# Patient Record
Sex: Female | Born: 1995
Health system: Southern US, Community
[De-identification: ages and names within clinical notes are randomized; demographics above are authoritative.]

## PROBLEM LIST (undated history)

## (undated) DIAGNOSIS — Q2112 Patent foramen ovale: Secondary | ICD-10-CM

## (undated) DIAGNOSIS — B2790 Infectious mononucleosis, unspecified without complication: Secondary | ICD-10-CM

## (undated) DIAGNOSIS — Q211 Atrial septal defect: Secondary | ICD-10-CM

## (undated) HISTORY — DX: Infectious mononucleosis, unspecified without complication: B27.90

## (undated) HISTORY — DX: Patent foramen ovale: Q21.12

## (undated) HISTORY — PX: OTHER SURGICAL HISTORY: SHX169

## (undated) HISTORY — DX: Atrial septal defect: Q21.1

---
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
## 2017-02-13 ENCOUNTER — Ambulatory Visit (INDEPENDENT_AMBULATORY_CARE_PROVIDER_SITE_OTHER): Payer: BLUE CROSS/BLUE SHIELD | Admitting: Family Medicine

## 2017-02-13 ENCOUNTER — Encounter: Payer: Self-pay | Admitting: Family Medicine

## 2017-02-13 VITALS — BP 96/56 | HR 86 | Temp 98.9°F | Ht 72.05 in | Wt 147.6 lb

## 2017-02-13 DIAGNOSIS — Z0000 Encounter for general adult medical examination without abnormal findings: Secondary | ICD-10-CM | POA: Diagnosis not present

## 2017-02-13 DIAGNOSIS — Z3009 Encounter for other general counseling and advice on contraception: Secondary | ICD-10-CM

## 2017-02-13 DIAGNOSIS — Z13220 Encounter for screening for lipoid disorders: Secondary | ICD-10-CM | POA: Diagnosis not present

## 2017-02-13 DIAGNOSIS — D649 Anemia, unspecified: Secondary | ICD-10-CM | POA: Insufficient documentation

## 2017-02-13 DIAGNOSIS — D500 Iron deficiency anemia secondary to blood loss (chronic): Secondary | ICD-10-CM | POA: Diagnosis not present

## 2017-02-13 LAB — BASIC METABOLIC PANEL
BUN: 11 mg/dL (ref 6–23)
CO2: 26 mEq/L (ref 19–32)
Calcium: 9.2 mg/dL (ref 8.4–10.5)
Chloride: 104 mEq/L (ref 96–112)
Creatinine, Ser: 0.74 mg/dL (ref 0.40–1.50)
GFR: 173.65 mL/min (ref >60.00)
Glucose, Bld: 83 mg/dL (ref 70–99)
Potassium: 3.7 mEq/L (ref 3.5–5.1)
Sodium: 137 mEq/L (ref 135–145)

## 2017-02-13 LAB — LIPID PANEL
Cholesterol: 187 mg/dL (ref 0–200)
HDL: 50.9 mg/dL (ref >39.00)
LDL Cholesterol: 112 mg/dL — ABNORMAL HIGH (ref 0–99)
NonHDL: 136.57
Total CHOL/HDL Ratio: 4
Triglycerides: 121 mg/dL (ref 0.0–149.0)
VLDL: 24.2 mg/dL (ref 0.0–40.0)

## 2017-02-13 LAB — CBC
HCT: 32.1 % — ABNORMAL LOW (ref 36.0–49.0)
Hemoglobin: 10.6 g/dL — ABNORMAL LOW (ref 12.0–16.0)
MCHC: 33.2 g/dL (ref 31.0–37.0)
MCV: 88.4 fl (ref 78.0–98.0)
Platelets: 286 10*3/uL (ref 150.0–575.0)
RBC: 3.63 Mil/uL — ABNORMAL LOW (ref 3.80–5.70)
RDW: 15.2 % (ref 11.4–15.5)
WBC: 6.8 10*3/uL (ref 4.5–13.5)

## 2017-02-13 LAB — FERRITIN: Ferritin: 6.2 ng/mL — ABNORMAL LOW (ref 22.0–322.0)

## 2017-02-13 NOTE — Patient Instructions (Addendum)
Sign release of information at the check out desk for gynecology records for any labs in the last 3 years  Records show only 1/3 gardasil shots in state record. Is it possible you had the rest somewhere else? Can we sign release of information for [facility] [place] for immunizations only. This is to reduce risk of cervical cancer.   Happy to see you yearly for physical and can also see GYN yearly for physical. Some younger patients like yourself will come in every 2-2.5 years as well which is fine

## 2017-02-13 NOTE — Progress Notes (Signed)
Phone: [phone]  Subjective:  Patient presents today to establish care.  Prior patient - last saw pediatrician- [facility]. Chief complaint-noted.   See problem oriented charting  The following were reviewed and entered/updated in epic: Past Medical History:  Diagnosis Date  . Mononucleosis    spring semester 2018  . PFO (patent foramen ovale)    closed without intervention   Patient Active Problem List   Diagnosis Date Noted  . Anemia 02/13/2017   Past Surgical History:  Procedure Laterality Date  . none      Family History  Problem Relation Age of Onset  . Anemia Mother   . Healthy Father   . Healthy Sister   . Healthy Brother   . Breast cancer Paternal Grandmother        died in 38s from this  . Breast cancer Paternal Aunt        died late 2s. not getting regular mammograms    Medications- reviewed and updated Current Outpatient Prescriptions  Medication Sig Dispense Refill  . etonogestrel-ethinyl estradiol (NUVARING) 0.12-0.015 MG/24HR vaginal ring Place 1 each vaginally every 28 (twenty-eight) days. Insert vaginally and leave in place for 3 consecutive weeks, then remove for 1 week.     No current facility-administered medications for this visit.     Allergies-reviewed and updated No Known Allergies  Social History   Social History  . Marital status: Single    Spouse name: N/A  . Number of children: N/A  . Years of education: N/A   Social History Main Topics  . Smoking status: Never Smoker  . Smokeless tobacco: Never Used  . Alcohol use No  . Drug use: No  . Sexual activity: Yes    Birth control/ protection: Other-see comments     Comment: Nuvaring   Other Topics Concern  . None   Social History Narrative   Single. Dating.       School at [employer]. 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       Hobbies: time with friends, movies    ROS--Full ROS was completed Review of Systems  Constitutional: Negative for chills and fever.  HENT: Negative for hearing loss and tinnitus.   Eyes: Positive for blurred vision (wears readers). Negative for double vision.  Respiratory: Negative for cough and hemoptysis.   Cardiovascular: Negative for chest pain and palpitations.  Gastrointestinal: Negative for heartburn and nausea.  Genitourinary: Negative for dysuria and urgency.  Musculoskeletal: Negative for myalgias and neck pain.  Skin: Negative for itching and rash.  Neurological: Negative for dizziness and headaches.  Endo/Heme/Allergies: Negative for polydipsia. Does not bruise/bleed easily.  Psychiatric/Behavioral: Negative for hallucinations and substance abuse.   Objective: BP (!) 96/56 (BP Location: Left Arm, Patient Position: Sitting, Cuff Size: Large)   Pulse 86   Temp 98.9 F (37.2 C) (Oral)   Ht 6' 0.05" (1.83 m)   Wt 147 lb 9.6 oz (67 kg)   SpO2 97%   BMI 19.99 kg/m  Gen: NAD, resting comfortably HEENT: Mucous membranes are moist. Oropharynx normal. TM normal. Eyes: sclera and lids normal, PERRLA Neck: no thyromegaly, no cervical lymphadenopathy CV: RRR no murmurs rubs or gallops Lungs: CTAB no crackles, wheeze, rhonchi Abdomen: soft/nontender/nondistended/normal bowel sounds. No rebound or guarding.  Ext:  no edema Skin: warm, dry Neuro: 5/5 strength in upper and lower extremities, normal gait, normal reflexes  Assessment/Plan:  21 y.o. female presenting for annual physical.  Health Maintenance counseling: 1. Anticipatory guidance: Patient counseled regarding regular dental exams q6 months, eye exams - sees every few years- far sighted, wearing seatbelts.  2. Risk factor reduction:  Advised patient of need for regular exercise and diet rich and fruits and vegetables to reduce risk of heart attack and stroke.  Exercise- cardio and lifting, abs. Diet- balanced diet.  Wt Readings from Last 3 Encounters:  02/13/17 147 lb 9.6 oz (67 kg) (37 %, Z= -0.32)*  3. Immunizations/screenings/ancillary studies- declines meningitis B as does not live in dorms. Unclear if had all gardasil- records show 1/3 so will get records Immunization History  Administered Date(s) Administered  . Tdap 01/23/2008  4. Cervical cancer screening- pap smear at age 21 needed 5. Breast cancer screening-  breast exam with GYN and mammogram - start at 35 screening- then yearly at 4040 (family history on dad's side though likely doesn't raise risk as much as if on moms side) 6. Colon cancer screening - no family history, start at age 21 7. STD screening- with GYN- negative within a year and no new partners since that time. Advised always using protection despite nuvaring- she is doing that.   Status of chronic or acute concerns   BP tends to run low  Anemia from heavy periods- get established with local GYN per her preference not to drive to [place]. Would prefer to see GYN for prescribing birth control as well. Update cbc and ferritin. She takes iron with periods only and discussed could take more regularly  1 year CPE  Orders Placed This Encounter  Procedures  . Basic metabolic panel    [place]    Order Specific Question:   Has the patient fasted?    Answer:   No  . CBC    [place]  . Lipid panel    [place]    Order Specific Question:   Has the patient fasted?    Answer:   No  . Ferritin  . Ambulatory referral to Gynecology    Referral Priority:   Routine    Referral Type:   Consultation    Referral Reason:   Specialty Services Required    Requested Specialty:   Gynecology    Number of Visits Requested:   1    Meds ordered this encounter  Medications  . etonogestrel-ethinyl estradiol (NUVARING) 0.12-0.015 MG/24HR vaginal ring    Sig: Place 1 each vaginally every 28 (twenty-eight) days. Insert vaginally and leave  in place for 3 consecutive weeks, then remove for 1 week.    Return precautions advised.  Tana ConchStephen Anwita Mencer, MD

## 2017-02-15 ENCOUNTER — Telehealth: Payer: Self-pay | Admitting: Family Medicine

## 2017-02-15 NOTE — Telephone Encounter (Signed)
Wanda IvoryGabrielle only received 1/3 of her gardasil vaccinations per records. I would recommend that she get 2 more injections 4 months apart. Please call her to suggest our recommendations  This is likely not quite as effective if she got all 3 within 6 months as suggested but I think it is reasonable and wise to do to reduce risks of cervical cancer.

## 2017-03-01 NOTE — Telephone Encounter (Signed)
Called and left a voicemail message asking for a return phone call 

## 2017-03-01 NOTE — Telephone Encounter (Signed)
Spoke with pt and advised. She is away at school and will call back to make an appt. Nothing further needed at this time.

## 2017-06-19 ENCOUNTER — Telehealth: Payer: Self-pay | Admitting: Family Medicine

## 2017-06-19 NOTE — Telephone Encounter (Signed)
Error. Please see note below ° °

## 2017-06-19 NOTE — Telephone Encounter (Signed)
Please advise regarding referral request.   Copied from CRM [phone]. Topic: Referral - Request >> Jun 19, 2017 12:34 PM Terisa Starraylor, Brittany L wrote: Reason for CRM:  Pt would like Dr Durene CalHunter to refer her to a OBGYN. Call back is [phone].

## 2017-06-20 ENCOUNTER — Other Ambulatory Visit: Payer: Self-pay

## 2017-06-20 DIAGNOSIS — Z124 Encounter for screening for malignant neoplasm of cervix: Secondary | ICD-10-CM

## 2017-06-20 NOTE — Telephone Encounter (Signed)
Referral placed for OBGYN

## 2020-03-12 ENCOUNTER — Ambulatory Visit (INDEPENDENT_AMBULATORY_CARE_PROVIDER_SITE_OTHER): Payer: BC Managed Care – PPO | Admitting: Obstetrics and Gynecology

## 2020-03-12 ENCOUNTER — Encounter: Payer: Self-pay | Admitting: Obstetrics and Gynecology

## 2020-03-12 ENCOUNTER — Other Ambulatory Visit: Payer: Self-pay

## 2020-03-12 VITALS — BP 126/80 | Ht 75.0 in | Wt 135.0 lb

## 2020-03-12 DIAGNOSIS — N644 Mastodynia: Secondary | ICD-10-CM

## 2020-03-12 DIAGNOSIS — N6001 Solitary cyst of right breast: Secondary | ICD-10-CM

## 2020-03-12 DIAGNOSIS — Z3009 Encounter for other general counseling and advice on contraception: Secondary | ICD-10-CM

## 2020-03-12 DIAGNOSIS — N946 Dysmenorrhea, unspecified: Secondary | ICD-10-CM

## 2020-03-12 DIAGNOSIS — N920 Excessive and frequent menstruation with regular cycle: Secondary | ICD-10-CM

## 2020-03-12 MED ORDER — TRANEXAMIC ACID 650 MG PO TABS: 1300.0000 mg | ORAL_TABLET | Freq: Three times a day (TID) | ORAL | 3 refills | Status: AC

## 2020-03-12 MED ORDER — NORETHINDRONE 0.35 MG PO TABS: 1.0000 | ORAL_TABLET | Freq: Every day | ORAL | 4 refills | Status: DC

## 2020-03-12 NOTE — Progress Notes (Signed)
Wanda Owens 1995/12/31 [id number]  SUBJECTIVE:  24 y.o. G0P0000 female new patient presents for evaluation of bilateral breast pain and also chronically heavy periods.  Periods are heavy with bad cramps for the first few days of period.  Periods last 5 days or less.  Her mother is an ER physician.  She has tried birth control pills with several different types and formulations, also tried NuvaRing.  She feels her periods have continued to be heavy with bad cramps.  She does take Midol and ibuprofen for strong cramp relief.  She denies any history of previous DVT/blood clots.  Hesitant to try Depo-Provera due to concern of possible weight gain. She also notes that her breasts get tender periodically throughout the month she thinks it might be worse when she is on her period.  She feels that the breast is swollen where the tenderness is when it is occurring.  She does not specifically feel any new breast lumps.  The discomfort occurs in both breasts.  Paternal side family history of breast cancer noted. 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  Her boyfriend is also Careers information officer.   Current Outpatient Medications  Medication Sig Dispense Refill   Birth control pill uncertain of which one    . Ferrous Sulfate (IRON PO) Take 1 tablet by mouth daily.     No current facility-administered medications for this visit.   Allergies: Patient has no known allergies.  Patient's last menstrual period was 02/28/2020.  Past medical history,surgical history, problem list, medications, allergies, family history and social history were all reviewed and documented as reviewed in the EPIC chart.  ROS: Pertinent positives and negatives as reviewed in HPI.   OBJECTIVE:  BP 126/80 (BP Location: Right Arm, Patient Position: Sitting, Cuff Size: Normal)   Ht 6' 3" (1.905 m)   Wt 135 lb (61.2 kg)   LMP 02/28/2020   BMI 16.87 kg/m  The patient appears well, alert, oriented x 3, in no  distress.  BREAST EXAM: breasts appear normal, diffusely nodular breasts, the right lower quadrant at the lateral aspect of the breast 4 cm off of the areola there is a soft mobile cystic mass about 2 to 3 cm in diameter, otherwise no suspicious masses, no skin or nipple changes or axillary nodes, no breast tenderness  Pelvic exam deferred by patient, she prefers to have a Pap smear at a future visit  Chaperone: Wandra Scot Bonham present during the examination   ASSESSMENT:  24 y.o. G0P0000 here for counseling regarding management of heavy menstrual periods with dysmenorrhea and intermittent bilateral breast pain  PLAN:  1.  Bilateral breast pain.  The patient indicates she typically does not wear bras due to difficulty in finding an appropriate size and it frequently causes her discomfort with wearing.  I recommended that she consider wearing a suitable bra that is comfortable for her as the lack of support chronically may cause musculoskeletal pain.  On the examination today, she does have diffusely cystic/nodular breasts, and there is a prominent cystic area on the right breast as noted above.  I will have staff coordinate a breast imaging-ultrasound for the patient to evaluate that right breast cyst.  I also encouraged the patient to keep track of her symptoms and the menstrual diary because it sounds like there also might be some hormonal component to it, she also has been trying different birth control pills and possibly this might be causing her to have also have  some increased breast symptoms.  Paternal family history of breast cancer as noted, uncertain if any BRCA testing was done.  I recommended to the patient that she check into that.  No maternal side family history of breast cancer.  The patient denies any known family history of uterine/endometrial, ovarian, stomach, colon cancers.  2.  Menorrhagia and dysmenorrhea.  I am not sure of which birth controls the patient has tried.  She is  hesitant to try injectables and implantable's such as the Nexplanon and IUD.  Alternative option I presented to her would be to try tranexamic acid/Lysteda 1300 mg 3 times daily x5 days, starting with onset of period.  She wanted to do that so I have sent a prescription to her pharmacy.  We discussed that due to changes in blood coagulation profile and behavior that I would recommend against simultaneously also using a typical combined hormonal contraceptive pill such as the one she is probably taking now.  I switched her over to the Ortho Micronor progestin only pill and gave her a prescription for that.  She will finish out her current birth control pill pack and then when she gets to her placebo week and has her period she will start the TXA at that point and then after her period is done she will start on the progestin only pill.  We reviewed that the progestin only pill is extremely sensitive to missed doses and missed timing of doses in order for it to provide contraceptive effect and failure is fairly common.  I highly suggested that she consider one of the progestin only LARC options and I will leave some information in her my chart regarding those options.  We also covered timed ibuprofen administration, every 6 hours take 400 to 600 mg with food for up to 5 days starting the day before she expects her period to start.  If that does not work for the dysmenorrhea then we could write a prescription for diclofenac or mefenamic acid instead.  I did recommend that she follow-up for a Pap smear and pelvic exam in the near future and we will see how she is doing on this new regimen at that time.    Joseph Pierini MD 03/12/20

## 2020-03-12 NOTE — Patient Instructions (Signed)
Dysmenorrhea  Dysmenorrhea refers to cramps caused by the muscles of the uterus tightening (contracting) during a menstrual period. Dysmenorrhea may be mild, or it may be severe enough to interfere with everyday activities for a few days each month. Primary dysmenorrhea is menstrual cramps that last a couple of days when you start having menstrual periods or soon after. This often begins after a teenager starts having her period. As a woman gets older or has a baby, the cramps will usually lessen or disappear. Secondary dysmenorrhea begins later in life and is caused by a disorder in the reproductive system. It lasts longer, and it may cause more pain than primary dysmenorrhea. The pain may start before the period and last a few days after the period. What are the causes? Dysmenorrhea is usually caused by an underlying problem, such as:  The tissue that lines the uterus (endometrium) growing outside of the uterus in other areas of the body (endometriosis).  Endometrial tissue growing into the muscular walls of the uterus (adenomyosis).  Blood vessels in the pelvis becoming filled with blood just before the menstrual period (pelvic congestive syndrome).  Overgrowth of cells (polyps) in the endometrium or the lower part of the uterus (cervix).  The uterus dropping down into the vagina (prolapse) due to stretched or weak muscles.  Bladder problems, such as infection or inflammation.  Intestinal problems, such as a tumor or irritable bowel syndrome.  Cancer of the reproductive organs or bladder.  A severely tipped uterus.  A cervix that is closed or has a very small opening.  Noncancerous (benign) tumors of the uterus (fibroids).  Pelvic inflammatory disease (PID).  Pelvic scarring (adhesions) from a previous surgery.  An ovarian cyst.  An IUD (intrauterine device). What increases the risk? You are more likely to develop this condition if:  You are younger than age 23.  You  started puberty early.  You have irregular or heavy bleeding.  You have never given birth.  You have a family history of dysmenorrhea.  You smoke. What are the signs or symptoms? Symptoms of this condition include:  Cramping, throbbing pain, or a feeling of fullness in the lower abdomen.  Lower back pain.  Periods lasting for longer than 7 days.  Headaches.  Bloating.  Fatigue.  Nausea or vomiting.  Diarrhea.  Sweating or dizziness.  Loose stools. How is this diagnosed? This condition may be diagnosed based on:  Your symptoms.  Your medical history.  A physical exam.  Blood tests.  A Pap test. This is a test in which cells from the cervix are tested for signs of cancer or infection.  A pregnancy test.  Imaging tests, such as: ? Ultrasound. ? A procedure to remove and examine a sample of endometrial tissue (dilation and curettage, D&C). ? A procedure to visually examine the inside of:  The uterus (hysteroscopy).  The abdomen or pelvis (laparoscopy).  The bladder (cystoscopy).  The intestine (colonoscopy).  The stomach (gastroscopy). ? X-rays. ? CT scan. ? MRI. How is this treated? Treatment depends on the cause of the dysmenorrhea. Treatment may include:  Pain medicine prescribed by your health care provider.  Birth control pills that contain the hormone progesterone.  An IUD that contains the hormone progesterone.  Medicines to control bleeding.  Hormone replacement therapy.  NSAIDs. These may help to stop the production of hormones that cause cramps.  Antidepressant medicines.  Surgery to remove adhesions, endometriosis, ovarian cysts, fibroids, or the entire uterus (hysterectomy).  Injections of  progesterone to stop the menstrual period.  A procedure to destroy the endometrium (endometrial ablation).  A procedure to cut the nerves in the bottom of the spine (sacrum) that go to the reproductive organs (presacral neurectomy).  A  procedure to apply an electric current to nerves in the sacrum (sacral nerve stimulation).  Exercise and physical therapy.  Meditation and yoga therapy.  Acupuncture. Work with your health care provider to determine what treatment or combination of treatments is best for you. Follow these instructions at home: Relieving pain and cramping  Apply heat to your lower back or abdomen when you experience pain or cramps. Use the heat source that your health care provider recommends, such as a moist heat pack or a heating pad. ? Place a towel between your skin and the heat source. ? Leave the heat on for 20-30 minutes. ? Remove the heat if your skin turns bright red. This is especially important if you are unable to feel pain, heat, or cold. You may have a greater risk of getting burned. ? Do not sleep with a heating pad on.  Do aerobic exercises, such as walking, swimming, or biking. This can help to relieve cramps.  Massage your lower back or abdomen to help relieve pain. General instructions  Take over-the-counter and prescription medicines only as told by your health care provider.  Do not drive or use heavy machinery while taking prescription pain medicine.  Avoid alcohol and caffeine during and right before your menstrual period. These can make cramps worse.  Do not use any products that contain nicotine or tobacco, such as cigarettes and e-cigarettes. If you need help quitting, ask your health care provider.  Keep all follow-up visits as told by your health care provider. This is important. Contact a health care provider if:  You have pain that gets worse or does not get better with medicine.  You have pain with sex.  You develop nausea or vomiting with your period that is not controlled with medicine. Get help right away if:  You faint. Summary  Dysmenorrhea refers to cramps caused by the muscles of the uterus tightening (contracting) during a menstrual  period.  Dysmenorrhea may be mild, or it may be severe enough to interfere with everyday activities for a few days each month.  Treatment depends on the cause of the dysmenorrhea.  Work with your health care provider to determine what treatment or combination of treatments is best for you. This information is not intended to replace advice given to you by your health care provider. Make sure you discuss any questions you have with your health care provider. Document Revised: 06/01/2017 Document Reviewed: 07/22/2016 Elsevier Patient Education  2020 [employer]  Menorrhagia  Menorrhagia is a condition in which menstrual periods are heavy or last longer than normal. With menorrhagia, most periods a woman has may cause enough blood loss and cramping that she becomes unable to take part in her usual activities. What are the causes? Common causes of this condition include:  Noncancerous growths in the uterus (polyps or fibroids).  An imbalance of the estrogen and progesterone hormones.  One of the ovaries not releasing an egg during one or more months.  A problem with the thyroid gland (hypothyroid).  Side effects of having an intrauterine device (IUD).  Side effects of some medicines, such as anti-inflammatory medicines or blood thinners.  A bleeding disorder that stops the blood from clotting normally. In some cases, the cause of this condition is not  known. What are the signs or symptoms? Symptoms of this condition include:  Routinely having to change your pad or tampon every 1-2 hours because it is completely soaked.  Needing to use pads and tampons at the same time because of heavy bleeding.  Needing to wake up to change your pads or tampons during the night.  Passing blood clots larger than 1 inch (2.5 cm) in size.  Having bleeding that lasts for more than 7 days.  Having symptoms of low iron levels (anemia), such as tiredness, fatigue, or shortness of breath. How is  this diagnosed? This condition may be diagnosed based on:  A physical exam.  Your symptoms and menstrual history.  Tests, such as: ? Blood tests to check if you are pregnant or have hormonal changes, a bleeding or thyroid disorder, anemia, or other problems. ? Pap test to check for cancerous changes, infections, or inflammation. ? Endometrial biopsy. This test involves removing a tissue sample from the lining of the uterus (endometrium) to be examined under a microscope. ? Pelvic ultrasound. This test uses sound waves to create images of your uterus, ovaries, and vagina. The images can show if you have fibroids or other growths. ? Hysteroscopy. For this test, a small telescope is used to look inside your uterus. How is this treated? Treatment may not be needed for this condition. If it is needed, the best treatment for you will depend on:  Whether you need to prevent pregnancy.  Your desire to have children in the future.  The cause and severity of your bleeding.  Your personal preference. Medicines are the first step in treatment. You may be treated with:  Hormonal birth control methods. These treatments reduce bleeding during your menstrual period. They include: ? Birth control pills. ? Skin patch. ? Vaginal ring. ? Shots (injections) that you get every 3 months. ? Hormonal IUD (intrauterine device). ? Implants that go under the skin.  Medicines that thicken blood and slow bleeding.  Medicines that reduce swelling, such as ibuprofen.  Medicines that contain an artificial (synthetic) hormone called progestin.  Medicines that make the ovaries stop working for a short time.  Iron supplements to treat anemia. If medicines do not work, surgery may be done. Surgical options may include:  Dilation and curettage (D&C). In this procedure, your health care provider opens (dilates) your cervix and then scrapes or suctions tissue from the endometrium to reduce menstrual  bleeding.  Operative hysteroscopy. In this procedure, a small tube with a light on the end (hysteroscope) is used to view your uterus and help remove polyps that may be causing heavy periods.  Endometrial ablation. This is when various techniques are used to permanently destroy your entire endometrium. After endometrial ablation, most women have little or no menstrual flow. This procedure reduces your ability to become pregnant.  Endometrial resection. In this procedure, an electrosurgical wire loop is used to remove the endometrium. This procedure reduces your ability to become pregnant.  Hysterectomy. This is surgical removal of the uterus. This is a permanent procedure that stops menstrual periods. Pregnancy is not possible after a hysterectomy. Follow these instructions at home: Medicines  Take over-the-counter and prescription medicines exactly as told by your health care provider. This includes iron pills.  Do not change or switch medicines without asking your health care provider.  Do not take aspirin or medicines that contain aspirin 1 week before or during your menstrual period. Aspirin may make bleeding worse. General instructions  If you need  to change your sanitary pad or tampon more than once every 2 hours, limit your activity until the bleeding stops.  Iron pills can cause constipation. To prevent or treat constipation while you are taking prescription iron supplements, your health care provider may recommend that you: ? Drink enough fluid to keep your urine clear or pale yellow. ? Take over-the-counter or prescription medicines. ? Eat foods that are high in fiber, such as fresh fruits and vegetables, whole grains, and beans. ? Limit foods that are high in fat and processed sugars, such as fried and sweet foods.  Eat well-balanced meals, including foods that are high in iron. Foods that have a lot of iron include leafy green vegetables, meat, liver, eggs, and whole grain  breads and cereals.  Do not try to lose weight until the abnormal bleeding has stopped and your blood iron level is back to normal. If you need to lose weight, work with your health care provider to lose weight safely.  Keep all follow-up visits as told by your health care provider. This is important. Contact a health care provider if:  You soak through a pad or tampon every 1 or 2 hours, and this happens every time you have a period.  You need to use pads and tampons at the same time because you are bleeding so much.  You have nausea, vomiting, diarrhea, or other problems related to medicines you are taking. Get help right away if:  You soak through more than a pad or tampon in 1 hour.  You pass clots bigger than 1 inch (2.5 cm) wide.  You feel short of breath.  You feel like your heart is beating too fast.  You feel dizzy or faint.  You feel very weak or tired. Summary  Menorrhagia is a condition in which menstrual periods are heavy or last longer than normal.  Treatment will depend on the cause of the condition and may include medicines or procedures.  Take over-the-counter and prescription medicines exactly as told by your health care provider. This includes iron pills.  Get help right away if you have heavy bleeding that soaks through more than a pad or tampon in 1 hour, you are passing large clots, or you feel dizzy, faint or short of breath. This information is not intended to replace advice given to you by your health care provider. Make sure you discuss any questions you have with your health care provider. Document Revised: 09/26/2017 Document Reviewed: 06/12/2016 Elsevier Patient Education  2020 [employer]. Levonorgestrel intrauterine device (IUD) What is this medicine? LEVONORGESTREL IUD (LEE voe nor jes trel) is a contraceptive (birth control) device. The device is placed inside the uterus by a healthcare professional. It is used to prevent pregnancy. This  device can also be used to treat heavy bleeding that occurs during your period. This medicine may be used for other purposes; ask your health care provider or pharmacist if you have questions. COMMON BRAND NAME(S): Cameron Ali What should I tell my health care provider before I take this medicine? They need to know if you have any of these conditions:  abnormal Pap smear  cancer of the breast, uterus, or cervix  diabetes  endometritis  genital or pelvic infection now or in the past  have more than one sexual partner or your partner has more than one partner  heart disease  history of an ectopic or tubal pregnancy  immune system problems  IUD in place  liver disease or  tumor  problems with blood clots or take blood-thinners  seizures  use intravenous drugs  uterus of unusual shape  vaginal bleeding that has not been explained  an unusual or allergic reaction to levonorgestrel, other hormones, silicone, or polyethylene, medicines, foods, dyes, or preservatives  pregnant or trying to get pregnant  breast-feeding How should I use this medicine? This device is placed inside the uterus by a health care professional. Talk to your pediatrician regarding the use of this medicine in children. Special care may be needed. Overdosage: If you think you have taken too much of this medicine contact a poison control center or emergency room at once. NOTE: This medicine is only for you. Do not share this medicine with others. What if I miss a dose? This does not apply. Depending on the brand of device you have inserted, the device will need to be replaced every 3 to 6 years if you wish to continue using this type of birth control. What may interact with this medicine? Do not take this medicine with any of the following medications:  amprenavir  bosentan  fosamprenavir This medicine may also interact with the following  medications:  aprepitant  armodafinil  barbiturate medicines for inducing sleep or treating seizures  bexarotene  boceprevir  griseofulvin  medicines to treat seizures like carbamazepine, ethotoin, felbamate, oxcarbazepine, phenytoin, topiramate  modafinil  pioglitazone  rifabutin  rifampin  rifapentine  some medicines to treat HIV infection like atazanavir, efavirenz, indinavir, lopinavir, nelfinavir, tipranavir, ritonavir  St. John's wort  warfarin This list may not describe all possible interactions. Give your health care provider a list of all the medicines, herbs, non-prescription drugs, or dietary supplements you use. Also tell them if you smoke, drink alcohol, or use illegal drugs. Some items may interact with your medicine. What should I watch for while using this medicine? Visit your doctor or health care professional for regular check ups. See your doctor if you or your partner has sexual contact with others, becomes HIV positive, or gets a sexual transmitted disease. This product does not protect you against HIV infection (AIDS) or other sexually transmitted diseases. You can check the placement of the IUD yourself by reaching up to the top of your vagina with clean fingers to feel the threads. Do not pull on the threads. It is a good habit to check placement after each menstrual period. Call your doctor right away if you feel more of the IUD than just the threads or if you cannot feel the threads at all. The IUD may come out by itself. You may become pregnant if the device comes out. If you notice that the IUD has come out use a backup birth control method like condoms and call your health care provider. Using tampons will not change the position of the IUD and are okay to use during your period. This IUD can be safely scanned with magnetic resonance imaging (MRI) only under specific conditions. Before you have an MRI, tell your healthcare provider that you have an  IUD in place, and which type of IUD you have in place. What side effects may I notice from receiving this medicine? Side effects that you should report to your doctor or health care professional as soon as possible:  allergic reactions like skin rash, itching or hives, swelling of the face, lips, or tongue  fever, flu-like symptoms  genital sores  high blood pressure  no menstrual period for 6 weeks during use  pain, swelling, warmth in  the leg  pelvic pain or tenderness  severe or sudden headache  signs of pregnancy  stomach cramping  sudden shortness of breath  trouble with balance, talking, or walking  unusual vaginal bleeding, discharge  yellowing of the eyes or skin Side effects that usually do not require medical attention (report to your doctor or health care professional if they continue or are bothersome):  acne  breast pain  change in sex drive or performance  changes in weight  cramping, dizziness, or faintness while the device is being inserted  headache  irregular menstrual bleeding within first 3 to 6 months of use  nausea This list may not describe all possible side effects. Call your doctor for medical advice about side effects. You may report side effects to FDA at 1-800-FDA-1088. Where should I keep my medicine? This does not apply. NOTE: This sheet is a summary. It may not cover all possible information. If you have questions about this medicine, talk to your doctor, pharmacist, or health care provider.  2020 Elsevier/Gold Standard (2018-04-30 13:22:01) Etonogestrel implant What is this medicine? ETONOGESTREL (et oh noe JES trel) is a contraceptive (birth control) device. It is used to prevent pregnancy. It can be used for up to 3 years. This medicine may be used for other purposes; ask your health care provider or pharmacist if you have questions. COMMON BRAND NAME(S): Implanon, Nexplanon What should I tell my health care provider before  I take this medicine? They need to know if you have any of these conditions:  abnormal vaginal bleeding  blood vessel disease or blood clots  breast, cervical, endometrial, ovarian, liver, or uterine cancer  diabetes  gallbladder disease  heart disease or recent heart attack  high blood pressure  high cholesterol or triglycerides  kidney disease  liver disease  migraine headaches  seizures  stroke  tobacco smoker  an unusual or allergic reaction to etonogestrel, anesthetics or antiseptics, other medicines, foods, dyes, or preservatives  pregnant or trying to get pregnant  breast-feeding How should I use this medicine? This device is inserted just under the skin on the inner side of your upper arm by a health care professional. Talk to your pediatrician regarding the use of this medicine in children. Special care may be needed. Overdosage: If you think you have taken too much of this medicine contact a poison control center or emergency room at once. NOTE: This medicine is only for you. Do not share this medicine with others. What if I miss a dose? This does not apply. What may interact with this medicine? Do not take this medicine with any of the following medications:  amprenavir  fosamprenavir This medicine may also interact with the following medications:  acitretin  aprepitant  armodafinil  bexarotene  bosentan  carbamazepine  certain medicines for fungal infections like fluconazole, ketoconazole, itraconazole and voriconazole  certain medicines to treat hepatitis, HIV or AIDS  cyclosporine  felbamate  griseofulvin  lamotrigine  modafinil  oxcarbazepine  phenobarbital  phenytoin  primidone  rifabutin  rifampin  rifapentine  St. John's wort  topiramate This list may not describe all possible interactions. Give your health care provider a list of all the medicines, herbs, non-prescription drugs, or dietary supplements you  use. Also tell them if you smoke, drink alcohol, or use illegal drugs. Some items may interact with your medicine. What should I watch for while using this medicine? This product does not protect you against HIV infection (AIDS) or other sexually transmitted diseases.  You should be able to feel the implant by pressing your fingertips over the skin where it was inserted. Contact your doctor if you cannot feel the implant, and use a non-hormonal birth control method (such as condoms) until your doctor confirms that the implant is in place. Contact your doctor if you think that the implant may have broken or become bent while in your arm. You will receive a user card from your health care provider after the implant is inserted. The card is a record of the location of the implant in your upper arm and when it should be removed. Keep this card with your health records. What side effects may I notice from receiving this medicine? Side effects that you should report to your doctor or health care professional as soon as possible:  allergic reactions like skin rash, itching or hives, swelling of the face, lips, or tongue  breast lumps, breast tissue changes, or discharge  breathing problems  changes in emotions or moods  coughing up blood  if you feel that the implant may have broken or bent while in your arm  high blood pressure  pain, irritation, swelling, or bruising at the insertion site  scar at site of insertion  signs of infection at the insertion site such as fever, and skin redness, pain or discharge  signs and symptoms of a blood clot such as breathing problems; changes in vision; chest pain; severe, sudden headache; pain, swelling, warmth in the leg; trouble speaking; sudden numbness or weakness of the face, arm or leg  signs and symptoms of liver injury like dark yellow or brown urine; general ill feeling or flu-like symptoms; light-colored stools; loss of appetite; nausea; right  upper belly pain; unusually weak or tired; yellowing of the eyes or skin  unusual vaginal bleeding, discharge Side effects that usually do not require medical attention (report to your doctor or health care professional if they continue or are bothersome):  acne  breast pain or tenderness  headache  irregular menstrual bleeding  nausea This list may not describe all possible side effects. Call your doctor for medical advice about side effects. You may report side effects to FDA at 1-800-FDA-1088. Where should I keep my medicine? This drug is given in a hospital or clinic and will not be stored at home. NOTE: This sheet is a summary. It may not cover all possible information. If you have questions about this medicine, talk to your doctor, pharmacist, or health care provider.  2020 Elsevier/Gold Standard (2019-04-01 11:33:04)       Instructions for today  1.  Wanda Owens out your current birth control pill, when you start your placebo week/period, then start taking the tranexamic acid as prescribed 3 times daily for 5 days.  As you.  Is settling down I would recommend starting the new progestin only birth control pill at that point. **Do not take the old birth control pill (that has both estrogen and progesterone) and the TXA as this increases your DVT/internal vein blood clot risk which is dangerous**  2.  For heavy periods and cramping, it sometimes helps to start taking scheduled ibuprofen 400 to 600 mg every 6 hours with food starting the day before you expect your period to begin and continue taking it that way for 5 days  3.  Remember with taking the progesterone only pill you need to be very consistent about timing every day and not miss any doses or it might not provide for effective birth control  in any given cycle  4.  If you want to make an appointment for a pelvic exam and Pap smear please do so as soon as possible.  Also consider the Mirena/Skyla IUD or the Nexplanon for  other ways of providing effective birth control that are good at also helping with heavy periods and cramping  5.  Someone from our office will be contacting you about an imaging test to evaluate the breast cyst on the right side  6.  Consider keeping notes on your breast symptoms in relation to her menstrual cycle so we could review that at another time.  Consider trying to find a bra that is suitable and comfortable for more regular use as sometimes lack of breast support chronically can lead to more breast pain.

## 2020-03-15 ENCOUNTER — Telehealth: Payer: Self-pay | Admitting: *Deleted

## 2020-03-15 DIAGNOSIS — N6001 Solitary cyst of right breast: Secondary | ICD-10-CM

## 2020-03-15 NOTE — Telephone Encounter (Signed)
Patient scheduled on 03/18/20 @ 9:15am at the [facility] center of [facility]. Patient informed.

## 2020-03-15 NOTE — Telephone Encounter (Signed)
-----   Message from Theresia Majors, MD sent at 03/12/2020  5:15 PM EDT ----- Regarding: breast ultrasound Can we please help the patient schedule a breast ultrasound to evaluate a cystic lump in her right lateral lower quadrant of her right breast?  Thank you

## 2020-03-18 ENCOUNTER — Other Ambulatory Visit: Payer: Self-pay

## 2020-03-25 ENCOUNTER — Ambulatory Visit
Admission: RE | Admit: 2020-03-25 | Discharge: 2020-03-25 | Disposition: A | Discharge status: Home / Self Care | Payer: Self-pay | Source: Ambulatory Visit | Attending: Obstetrics and Gynecology | Admitting: Obstetrics and Gynecology

## 2020-03-25 ENCOUNTER — Other Ambulatory Visit: Payer: Self-pay

## 2020-03-25 DIAGNOSIS — N6001 Solitary cyst of right breast: Secondary | ICD-10-CM

## 2020-03-25 NOTE — Progress Notes (Signed)
Please tell patient that the right breast ultrasound looks okay but she should keep an eye on the lump that we detected on the exam let us know if there are any changes, pain, other concerning symptoms  Also I really want her to look into the genetic testing history that has been performed for her family in regards to breast cancer risk, and we should have her do genetic counseling on her own regarding her breast cancer risk due to her family history

## 2021-06-28 IMAGING — US US BREAST*R* LIMITED INC AXILLA
1 series · 12 of 12 positions shown · non-contrast
Comparison: None.

CLINICAL DATA: 23-year-old female with a palpable area of concern
in the retroareolar/lower outer quadrant of the right breast.

EXAM:
ULTRASOUND OF THE RIGHT BREAST

[Series 1: us breast*right* limited inc axilla · 0.06mm/px · 12 of 12 slices shown]
[im 1/12]
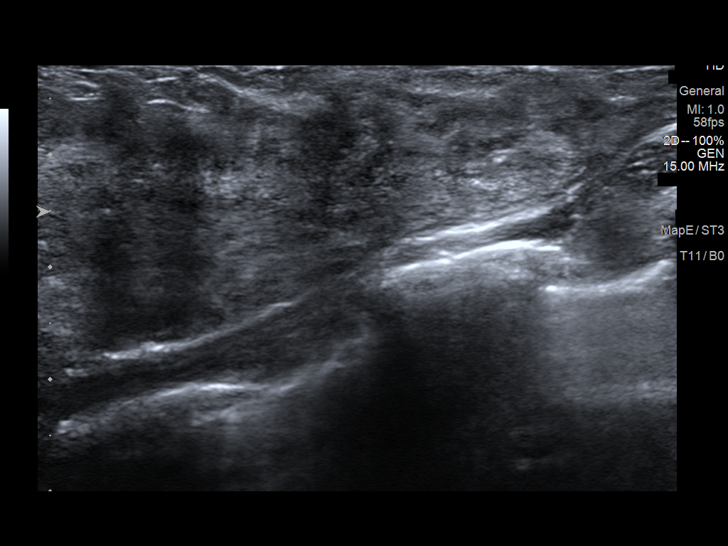
[im 2/12]
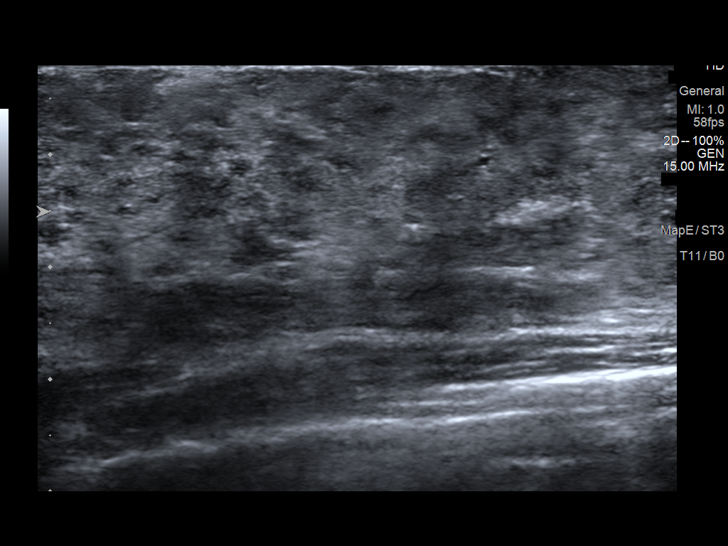
[im 3/12]
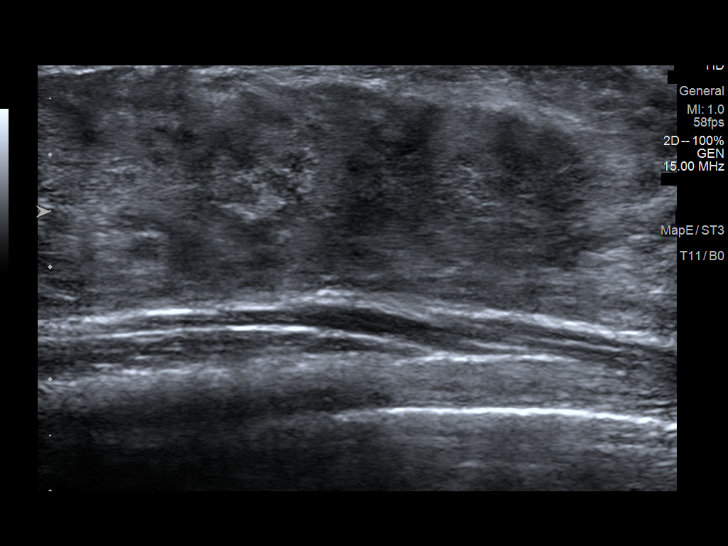
[im 4/12]
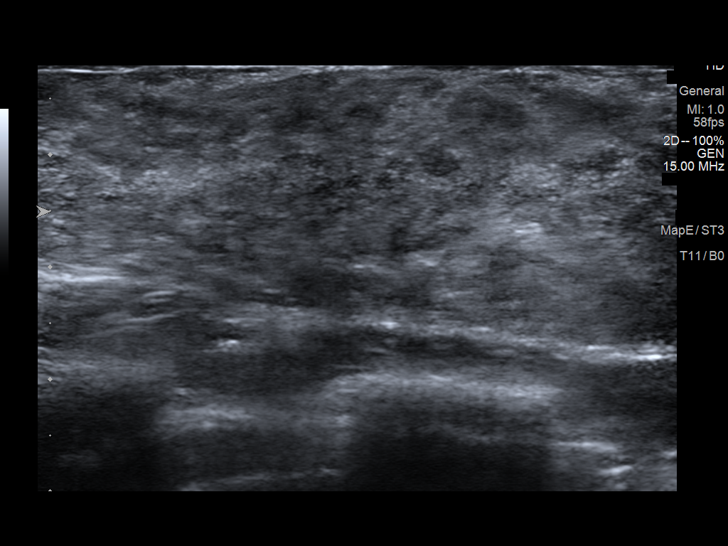
[im 5/12]
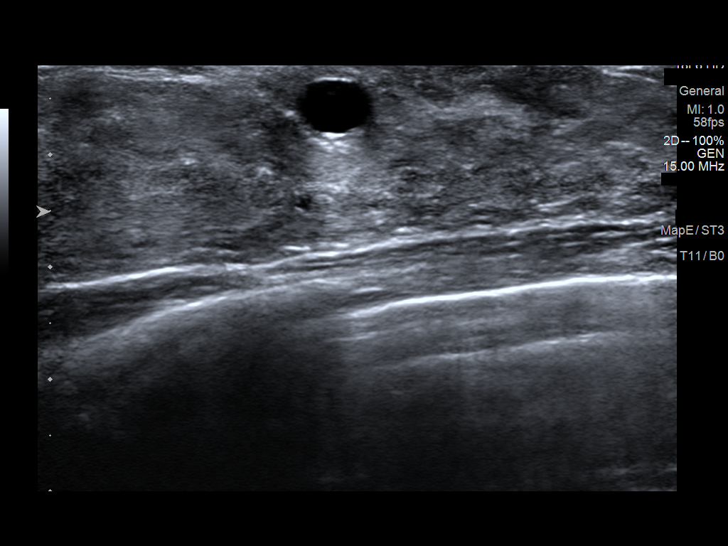
[im 6/12]
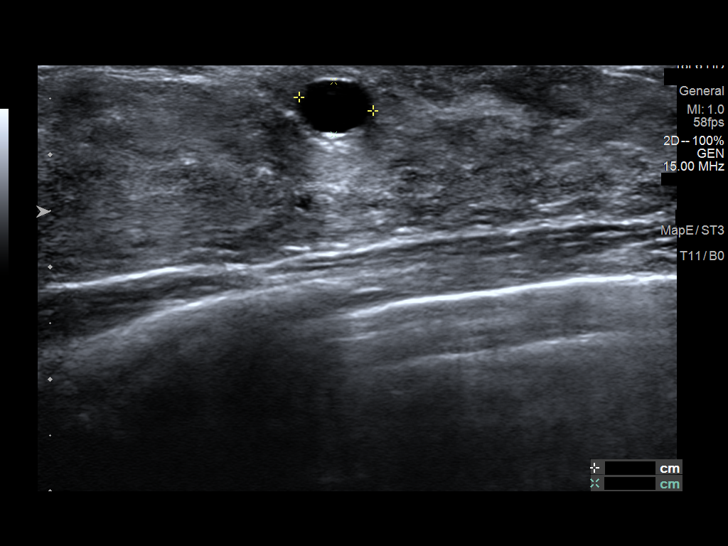
[im 7/12]
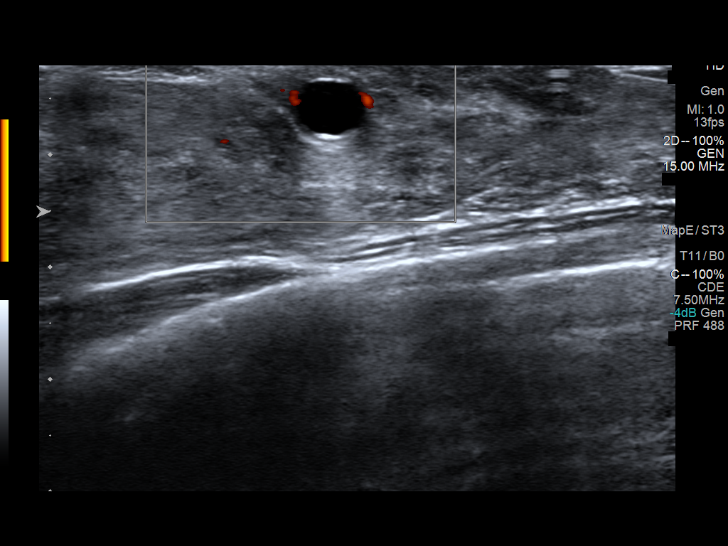
[im 8/12]
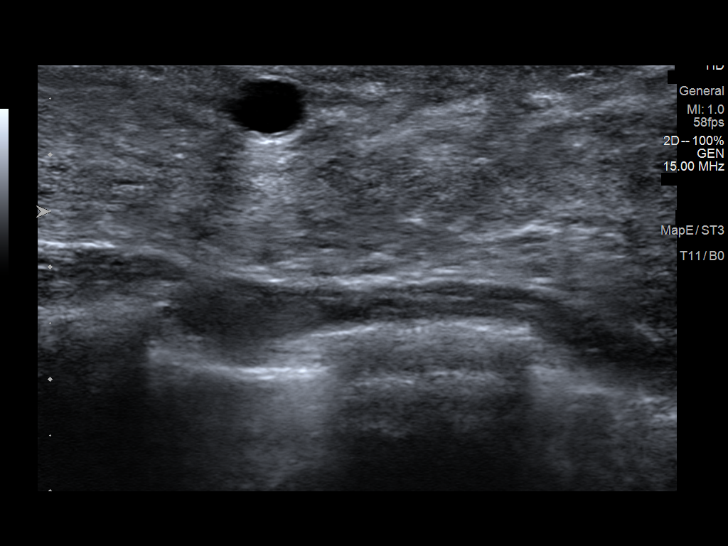
[im 9/12]
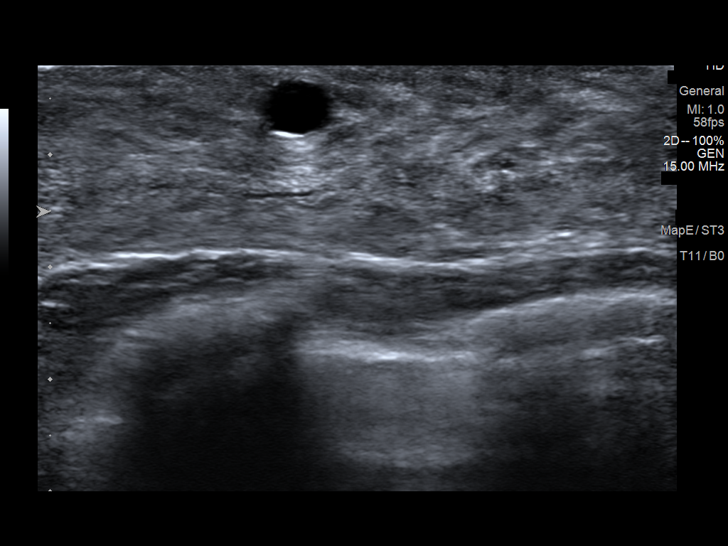
[im 10/12]
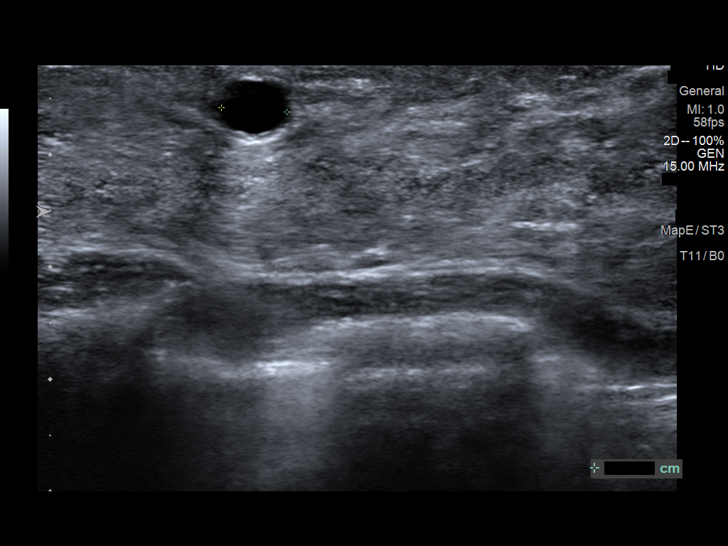
[im 11/12]
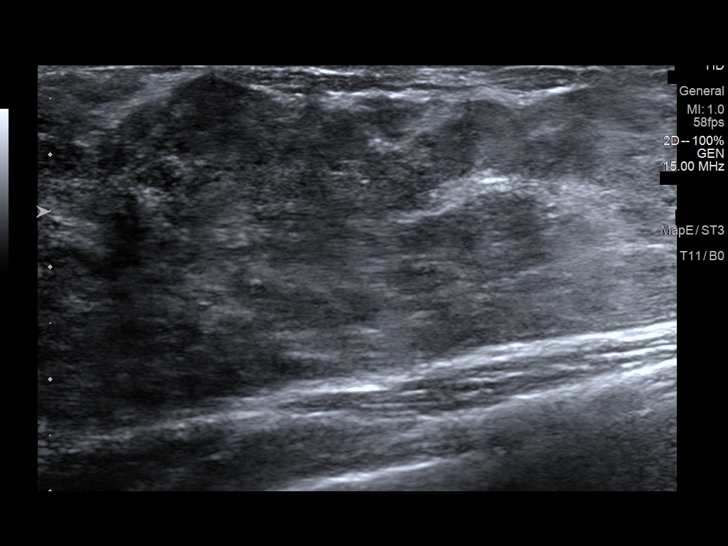
[im 12/12]
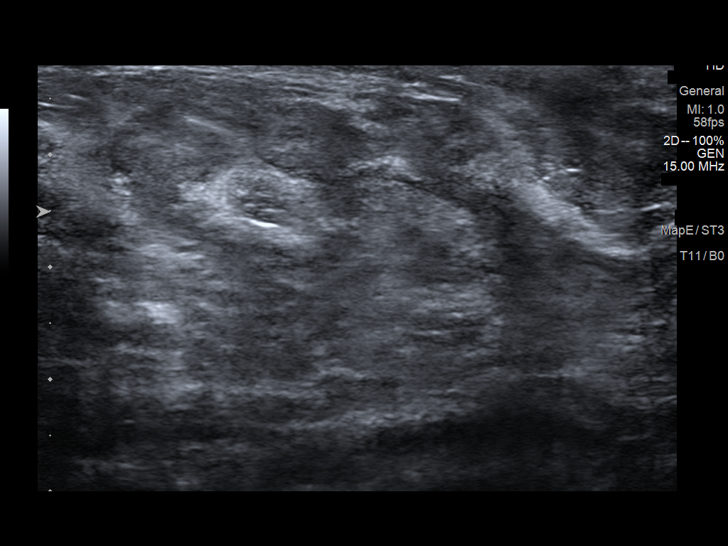

[12 of 12 positions shown; findings below may reference images not displayed]

FINDINGS: Physical examination of the outer to central right breast reveals
generalized thickening without a discrete underlying palpable mass.

Targeted ultrasound of the right breast was performed. No suspicious
masses or abnormalities identified, only extremely dense
fibroglandular tissue identified with a small cyst incidentally seen
at 7 o'clock 1 cm from the nipple measuring 0.7 x 0.5 x 0.6 cm.
IMPRESSION: No suspicious masses or abnormality seen in the region of palpable
concern in the right breast, only extremely dense fibroglandular
tissue and a small cyst identified.

RECOMMENDATION:
1. Recommend further management of patient's right breast symptoms
be based on clinical assessment.

2. Screening mammogram at age 40 unless there are persistent or
intervening clinical concerns. (Code:SL-C-FRI)

3. The patient does report a strong family history of breast cancer,
including in her paternal grandmother and paternal aunt both of
which are deceased from breast cancer. Consider genetic counseling.
The American Cancer Society recommends annual MRI and mammography in
patients with an estimated lifetime risk of developing breast cancer
greater than 20 - 25%, or who are known or suspected to be positive
for the breast cancer gene.

I have discussed the findings and recommendations with the patient.
If applicable, a reminder letter will be sent to the patient
regarding the next appointment.

BI-RADS CATEGORY  2: Benign.

## 2022-01-24 ENCOUNTER — Ambulatory Visit: Payer: BC Managed Care – PPO | Admitting: Family Medicine

## 2022-01-24 ENCOUNTER — Encounter: Payer: Self-pay | Admitting: Family Medicine

## 2022-01-24 VITALS — BP 108/72 | HR 80 | Temp 98.9°F | Ht 75.0 in | Wt 131.2 lb

## 2022-01-24 DIAGNOSIS — Z0000 Encounter for general adult medical examination without abnormal findings: Secondary | ICD-10-CM

## 2022-01-24 DIAGNOSIS — Z1159 Encounter for screening for other viral diseases: Secondary | ICD-10-CM | POA: Diagnosis not present

## 2022-01-24 DIAGNOSIS — Z23 Encounter for immunization: Secondary | ICD-10-CM | POA: Diagnosis not present

## 2022-01-24 DIAGNOSIS — E785 Hyperlipidemia, unspecified: Secondary | ICD-10-CM

## 2022-01-24 DIAGNOSIS — D500 Iron deficiency anemia secondary to blood loss (chronic): Secondary | ICD-10-CM

## 2022-01-24 LAB — COMPREHENSIVE METABOLIC PANEL
ALT: 13 U/L (ref 0–35)
AST: 17 U/L (ref 0–37)
Albumin: 4.3 g/dL (ref 3.5–5.2)
Alkaline Phosphatase: 61 U/L (ref 39–117)
BUN: 19 mg/dL (ref 6–23)
CO2: 25 mEq/L (ref 19–32)
Calcium: 9.6 mg/dL (ref 8.4–10.5)
Chloride: 107 mEq/L (ref 96–112)
Creatinine, Ser: 0.73 mg/dL (ref 0.40–1.20)
GFR: 114.79 mL/min (ref >60.00)
Glucose, Bld: 89 mg/dL (ref 70–99)
Potassium: 3.9 mEq/L (ref 3.5–5.1)
Sodium: 140 mEq/L (ref 135–145)
Total Bilirubin: 0.4 mg/dL (ref 0.2–1.2)
Total Protein: 7.7 g/dL (ref 6.0–8.3)

## 2022-01-24 LAB — CBC WITH DIFFERENTIAL/PLATELET
Basophils Absolute: 0.1 10*3/uL (ref 0.0–0.1)
Basophils Relative: 0.9 % (ref 0.0–3.0)
Eosinophils Absolute: 0 10*3/uL (ref 0.0–0.7)
Eosinophils Relative: 0.4 % (ref 0.0–5.0)
HCT: 25.1 % — ABNORMAL LOW (ref 36.0–46.0)
Hemoglobin: 7.6 g/dL — CL (ref 12.0–15.0)
Lymphocytes Relative: 30.1 % (ref 12.0–46.0)
Lymphs Abs: 1.6 10*3/uL (ref 0.7–4.0)
MCHC: 29.6 g/dL — ABNORMAL LOW (ref 30.0–36.0)
MCV: 71 fl — ABNORMAL LOW (ref 78.0–100.0)
Monocytes Absolute: 0.5 10*3/uL (ref 0.1–1.0)
Monocytes Relative: 9.4 % (ref 3.0–12.0)
Neutro Abs: 3.2 10*3/uL (ref 1.4–7.7)
Neutrophils Relative %: 59.2 % (ref 43.0–77.0)
Platelets: 347 10*3/uL (ref 150.0–400.0)
RBC: 3.54 Mil/uL — ABNORMAL LOW (ref 3.87–5.11)
RDW: 19.9 % — ABNORMAL HIGH (ref 11.5–15.5)
WBC: 5.5 10*3/uL (ref 4.0–10.5)

## 2022-01-24 LAB — LIPID PANEL
Cholesterol: 208 mg/dL — ABNORMAL HIGH (ref 0–200)
HDL: 68.7 mg/dL (ref >39.00)
LDL Cholesterol: 129 mg/dL — ABNORMAL HIGH (ref 0–99)
NonHDL: 139.35
Total CHOL/HDL Ratio: 3
Triglycerides: 52 mg/dL (ref 0.0–149.0)
VLDL: 10.4 mg/dL (ref 0.0–40.0)

## 2022-01-24 LAB — IBC + FERRITIN
Ferritin: 4 ng/mL — ABNORMAL LOW (ref 10.0–291.0)
Iron: 12 ug/dL — ABNORMAL LOW (ref 42–145)
Saturation Ratios: 1.9 % — ABNORMAL LOW (ref 20.0–50.0)
TIBC: 635.6 ug/dL — ABNORMAL HIGH (ref 250.0–450.0)
Transferrin: 454 mg/dL — ABNORMAL HIGH (ref 212.0–360.0)

## 2022-01-24 LAB — TSH: TSH: 1.64 u[IU]/mL (ref 0.35–5.50)

## 2022-01-24 MED ORDER — SUMATRIPTAN SUCCINATE 50 MG PO TABS: 50.0000 mg | ORAL_TABLET | Freq: Every day | ORAL | 5 refills | Status: DC | PRN

## 2022-01-24 NOTE — Addendum Note (Signed)
Addended by: Lieutenant Diego A on: 01/24/2022 10:22 AM   Modules accepted: Orders

## 2022-01-24 NOTE — Patient Instructions (Addendum)
Health Maintenance Due  Topic Date Due   CHLAMYDIA SCREENING ????- Sign release of information at the check out desk for last STD check with GYN with request for pap  Never done   PAP SMEAR will get this year at GYN. But also Sign release of information at the check out desk for last pap smear   Never done   Schedule ENT follow up   Tdap today Gardasil today- need to schedule 4 month nurse visit for 3rd gadasil/HPV vaccination  Would love to see you increase caloric intake- possibly adding lunch would be a good way if you can tolerate  I think to be proactive- considering therapy to give you more coping mechanisms for anxiety  Please stop by lab before you go If you have mychart- we will send your results within 3 business days of [place] receiving them.  If you do not have mychart- we will call you about results within 5 business days of [place] receiving them.  *please also note that you will see labs on mychart as soon as they post. I will later go in and write notes on them- will say "notes from Dr. Durene Cal"   Recommended follow up: Return in about 1 year (around 01/25/2023) for physical or sooner if needed.Schedule b4 you leave.

## 2022-01-24 NOTE — Progress Notes (Signed)
Phone [phone]   Subjective:  Patient presents today for their annual physical and to reestablish care-last seen in 2018. Chief complaint-noted.   See problem oriented charting- ROS- full  review of systems was completed and negative except for: some weight loss- had gained 5 lbs and then on our scale today back down 10 lbs- more underweight than usual, congestoin , post nasal drip, sinus pressure, sneezing- some allergies, vaginal discharge had BV- but has also had some yeast infections since that time (doesn't douche)- seeing again soon for pap, trouble sleeping with stress  The following were reviewed and entered/updated in epic: Past Medical History:  Diagnosis Date   Mononucleosis    spring semester 2018   PFO (patent foramen ovale)    closed without intervention   Patient Active Problem List   Diagnosis Date Noted   Anemia 02/13/2017   Past Surgical History:  Procedure Laterality Date   none      Family History  Problem Relation Age of Onset   Anemia Mother    Healthy Father    Healthy Sister    Healthy Brother    Breast cancer Paternal Grandmother        died in 59s from this   Breast cancer Paternal Aunt        died late 85s. not getting regular mammograms    Medications- reviewed and updated Current Outpatient Medications  Medication Sig Dispense Refill   Ferrous Sulfate (IRON PO) Take 1 tablet by mouth daily.     norethindrone (ORTHO MICRONOR) 0.35 MG tablet Take 1 tablet (0.35 mg total) by mouth daily. Take all pills in pack. Do not skip any pills. Must take at same time every day. Do not miss any doses. 84 tablet 4   SUMAtriptan (IMITREX) 50 MG tablet Take 1 tablet (50 mg total) by mouth daily as needed for migraine. May repeat in 2 hours if headache persists or recurs. Max 2 per day. 10 tablet 5   No current facility-administered medications for this visit.    Allergies-reviewed and updated No Known Allergies  Social History   Social History  Narrative   Single. Dating.       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 in fall 2023 for med school.  Cardiothoracic surgeon planned vs ortho.    Majoring in cell and molecular biology with major in chemistry      Hobbies: time with friends, movies   Objective  Objective:  BP 108/72   Pulse 80   Temp 98.9 F (37.2 C)   Ht 6\' 3"  (1.905 m)   Wt 131 lb 3.2 oz (59.5 kg)   SpO2 98%   BMI 16.40 kg/m  Gen: NAD, resting comfortably HEENT: Mucous membranes are moist. Oropharynx normal, thyroid normal on exam. No sinus tenderness Neck: no thyromegaly CV: RRR no murmurs rubs or gallops Lungs: CTAB no crackles, wheeze, rhonchi Abdomen: soft/nontender/nondistended/normal bowel sounds. No rebound or guarding.  Ext: no edema Skin: warm, dry Neuro: grossly normal, moves all extremities, PERRLA   Assessment and Plan   26 y.o. female presenting for annual physical.  Health Maintenance counseling: 1. Anticipatory guidance: Patient counseled regarding regular dental exams -q6 months, eye exams - no issues with vision,  avoiding smoking and second hand smoke, limiting alcohol to 1 beverage per day- minimal intake , no illicit drugs .   2. Risk factor reduction:  Advised patient of need for regular exercise and diet rich and  fruits and vegetables to reduce risk of heart attack and stroke.  Exercise- 2-3 days a week cardio, abs, yoga, pilates.  Diet/weight management-weight trending down recently- we will check TSH- she does skip breakfast other than smoothies and then skips lunch- discussed underweight- she thinks stress plays a part. Mentioned considering therapy for anxiety  Wt Readings from Last 3 Encounters:  01/24/22 131 lb 3.2 oz (59.5 kg)  03/12/20 135 lb (61.2 kg)  02/13/17 147 lb 9.6 oz (67 kg) (78 %, Z= 0.76)*   * Growth percentiles are based on CDC (Girls, 2-20 Years) data.  3. Immunizations/screenings/ancillary studies- gardasil had 1/3 on [facility]- finish regimen  with #2 today and #3 in 4 months.  Tdap today. Offered HCV screening Immunization History  Administered Date(s) Administered   Influenza,inj,Quad PF,6+ Mos 03/08/2017   Influenza-Unspecified 03/08/2017   Tdap 01/23/2008  4. Cervical cancer screening-  getting plugged in with GYN next month 5. Breast cancer screening-  breast exam  with GYN planned and mammogram - start 35-40. Paternal side family history - may start earlier- dad sister and mom with breast cancer 6. Colon cancer screening - no family history, start at age 14 7. Skin cancer screening- lower risk due to melanin content. advised regular sunscreen use. Denies worrisome, changing, or new skin lesions.  8. Birth control/STD check- remains on oral contraceptive. Gets tested with GYN and uses protection 9. Osteoporosis screening at 89- will plan on this later- underweight risk factor 10. Smoking associated screening - Never smoker  Status of chronic or acute concerns   #Recurrent sinus infection-patient reports issues since she was 26 years old-seems to be getting worse.  She reports being the 3 separate ENT physicians (once at [facility], 1 in [place], seen a female physician in [place]).  She also reports this is a trigger for migraines -has been told multiple polyps- was told to use saline treatments- helps some- has flonase and uses consistently.  - claritin/allegra type products have not been helpful -saline sinus rinses and Neti pot twice a week- encouraged to try daily in meantime until sees ENT - recommended follow up as shed like to consider surgical options - headaches with pressure behind both eyes. Can last all day long- takes ibuprofen 400mg  and nasal spray- does not help. Pulsating headache. Nauseous. Light sensitive. Disabling - 4/5 pound mnemonic- sounds like migraine- trial sumaptriptan- discussed needs to remain on birth control while on this  #Anemia-ongoing issues from heavy menstruation.  She is reestablishing with GYN next  month.  At last visit was taking oral contraceptives.  We will update CBC and ferritin - taking iron 3-4 times a week  #hyperlipidemia S: Medication:none  Lab Results  Component Value Date   CHOL 187 02/13/2017   HDL 50.90 02/13/2017   LDLCALC 112 (H) 02/13/2017   TRIG 121.0 02/13/2017   CHOLHDL 4 02/13/2017   A/P: hopefully improved- update lipids   Recommended follow up: Return in about 1 year (around 01/25/2023) for physical or sooner if needed.Schedule b4 you leave.  Lab/Order associations: NOT fasting   ICD-10-CM   1. Preventative health care  Z00.00 Hepatitis C antibody    CBC with Differential/Platelet    Comprehensive metabolic panel    Lipid panel    IBC + Ferritin    2. Iron deficiency anemia due to chronic blood loss  D50.0 IBC + Ferritin    3. Hyperlipidemia, unspecified hyperlipidemia type  E78.5 CBC with Differential/Platelet    Comprehensive metabolic panel  Lipid panel    TSH    4. Encounter for hepatitis C screening test for low risk patient  Z11.59 Hepatitis C antibody      Meds ordered this encounter  Medications   SUMAtriptan (IMITREX) 50 MG tablet    Sig: Take 1 tablet (50 mg total) by mouth daily as needed for migraine. May repeat in 2 hours if headache persists or recurs. Max 2 per day.    Dispense:  10 tablet    Refill:  5    Return precautions advised.  Tana Conch, MD

## 2022-01-25 LAB — HEPATITIS C ANTIBODY: Hepatitis C Ab: NONREACTIVE

## 2022-03-27 ENCOUNTER — Encounter: Payer: Self-pay | Admitting: *Deleted

## 2022-06-12 ENCOUNTER — Ambulatory Visit: Payer: BC Managed Care – PPO | Admitting: Family Medicine

## 2022-06-15 ENCOUNTER — Encounter: Payer: Self-pay | Admitting: *Deleted

## 2023-05-16 ENCOUNTER — Telehealth: Payer: Self-pay | Admitting: Family Medicine

## 2023-05-16 NOTE — Telephone Encounter (Signed)
Spoke with patient to confirm what lab work she needed, last ones done here were 12/2021. Patient stated "it's fine, I don't need them now." I asked 3 times to be sure and she stated "it's fine, I don't need them.'' Nothing further needed at this time.

## 2023-05-16 NOTE — Telephone Encounter (Signed)
Pt would like a copy of her labwork results. Please call pt when when printed.

## 2023-07-10 ENCOUNTER — Ambulatory Visit (INDEPENDENT_AMBULATORY_CARE_PROVIDER_SITE_OTHER): Payer: Self-pay | Admitting: Family Medicine

## 2023-07-10 ENCOUNTER — Encounter: Payer: Self-pay | Admitting: Family Medicine

## 2023-07-10 VITALS — BP 100/64 | HR 84 | Temp 98.1°F | Ht 75.0 in | Wt 130.0 lb

## 2023-07-10 DIAGNOSIS — G43809 Other migraine, not intractable, without status migrainosus: Secondary | ICD-10-CM

## 2023-07-10 DIAGNOSIS — D500 Iron deficiency anemia secondary to blood loss (chronic): Secondary | ICD-10-CM

## 2023-07-10 DIAGNOSIS — J329 Chronic sinusitis, unspecified: Secondary | ICD-10-CM

## 2023-07-10 DIAGNOSIS — G43909 Migraine, unspecified, not intractable, without status migrainosus: Secondary | ICD-10-CM | POA: Insufficient documentation

## 2023-07-10 DIAGNOSIS — R636 Underweight: Secondary | ICD-10-CM

## 2023-07-10 MED ORDER — AMOXICILLIN-POT CLAVULANATE 875-125 MG PO TABS: 1.0000 | ORAL_TABLET | Freq: Two times a day (BID) | ORAL | 0 refills | Status: AC

## 2023-07-10 NOTE — Assessment & Plan Note (Signed)
 For migraines (diagnosed/presumed last visit)-sumatriptan has been helpful-we can certainly refill if needed which she does not request this today.  Sinus issues can be a trigger so we are also referring to ENT.

## 2023-07-10 NOTE — Patient Instructions (Addendum)
 Get PAP Smear scheduled.  Call # listed below if you don't hear within a week from ENT  Trial Augmentin  for 7 days to at least settle things down but with recurrent issues not sure this is a long term solution to your issues so hoping ENT can help  Recommended follow up: Return in about 6 months (around 01/07/2024) for physical or sooner if needed.Schedule b4 you leave.  -we need to do bloodwork but you wanted to hold until next visit

## 2023-07-10 NOTE — Progress Notes (Signed)
 Phone [phone] In person visit   Subjective:   Wanda Owens is a 28 y.o. year old very pleasant female patient who presents for/with See problem oriented charting Chief Complaint  Patient presents with   ENT referral    Pt c/o getting bad sinus infections and headaches that are getting worse.    Past Medical History-  Patient Active Problem List   Diagnosis Date Noted   Migraine 07/10/2023   Anemia 02/13/2017    Medications- reviewed and updated Current Outpatient Medications  Medication Sig Dispense Refill   amoxicillin -clavulanate (AUGMENTIN ) 875-125 MG tablet Take 1 tablet by mouth 2 (two) times daily for 7 days. 14 tablet 0   Ferrous Sulfate (IRON PO) Take 1 tablet by mouth daily.     No current facility-administered medications for this visit.     Objective:  BP 100/64   Pulse 84   Temp 98.1 F (36.7 C)   Ht 6' 3 (1.905 m)   Wt 130 lb (59 kg)   SpO2 100%   BMI 16.25 kg/m  Gen: NAD, resting comfortably Tympanic membrane's normal bilaterally, oropharynx largely normal, nasal turbinates erythematous and edematous with some yellow discharge CV: RRR no murmurs rubs or gallops Lungs: CTAB no crackles, wheeze, rhonchi Ext: no edema     Assessment and Plan   # Sinus pressure/sinusitis S:patient reports going to [place] 18th or 19th of December and has had significant sinus pressure since that time both maxillary and frontal (hurts behind her eyes as well). Feels very stopped up- hard to get much out. No sick contacts. No fevers. Decongestant helps short term but overall better. Ibuprofen helped some at first but not now. She has not taken recent antibiotics -  would get intermittent issues once or twice a month prior to that but ibuprofen would help and would clear within a few days- this is definitely different  - no blurry vision  In July of last year reported long term issues since age 50 with seeing 3 separate ENT doctors ([facility], [facility] and one  doctor in [place]). Had been told multiple polyps - saline not helping anymore. Flonase didn't help so stopped. Claritin/allergra type products didn't help. Saline rinses help slightly short term. Can trigger her migraines. Sumatriptan  has helped migraine portion.  -timing wise wasn't able to see ENT last year and got better -always uses condoms for pregnancy prevention and actively on cycle A/P: Recurrent sinusitis issues for years-present issue started about 3 weeks ago-will treat with Augmentin  - With recurrent issues she would also like to establish with ENT again but would prefer to be within [facility]-referral placed today  For migraines-sumatriptan  has been helpful-we can certainly refill if needed which she does not request this today  #anemia- extremely low ferritin at 5 last year-  with hemoglobin 7.6- she has been taking iron twice a day since that time. Has not had a chance with her schedule to see GYN yet but plans to. First 2 days are heavy- total 5 days menstruation. Regular cycles monthly. Actively on cycle- wants to hold off on checking labs. No fatigue, shortness of breath or chest pain   #underweight- smoothie in the morning, eats dinner a decent size. Some small snacks.-Weight is down about 18 pounds since 2018-she is very busy with medical school and she thinks this contributes-prior weight loss was during college but she was also very busy during college-we discussed trying to add a protein during the day to add some extra calories - Can update full  blood work next visit at physical  Recommended follow up: Return in about 6 months (around 01/07/2024) for physical or sooner if needed.Schedule b4 you leave. Future Appointments  Date Time Provider Department Center  01/10/2024  9:00 AM Katrinka Garnette KIDD, MD [facility]    Lab/Order associations:   ICD-10-CM   1. Recurrent sinusitis  J32.9 Ambulatory referral to ENT    2. Iron deficiency anemia due to chronic blood loss  D50.0      3. Underweight  R63.6     4. Other migraine without status migrainosus, not intractable  G43.809       Meds ordered this encounter  Medications   amoxicillin -clavulanate (AUGMENTIN ) 875-125 MG tablet    Sig: Take 1 tablet by mouth 2 (two) times daily for 7 days.    Dispense:  14 tablet    Refill:  0    Return precautions advised.  Garnette Katrinka, MD

## 2023-07-11 ENCOUNTER — Encounter (INDEPENDENT_AMBULATORY_CARE_PROVIDER_SITE_OTHER): Payer: Self-pay | Admitting: Otolaryngology

## 2023-07-20 ENCOUNTER — Telehealth: Payer: Self-pay | Admitting: Family Medicine

## 2023-07-20 NOTE — Telephone Encounter (Signed)
LVM re: need insurance information/upload front and back of insurance card for [facility] 07/10/23

## 2023-07-27 ENCOUNTER — Telehealth: Payer: Self-pay | Admitting: Family Medicine

## 2023-07-27 NOTE — Telephone Encounter (Signed)
LVM x 3 re: need insurance information/upload front and back of insurance card for [facility] 07/10/23

## 2023-11-27 ENCOUNTER — Encounter: Admitting: Obstetrics & Gynecology

## 2024-01-10 ENCOUNTER — Encounter: Payer: No Typology Code available for payment source | Admitting: Family Medicine
